# Patient Record
Sex: Male | Born: 1995 | Hispanic: Yes | Marital: Married | Smoking: Never smoker
Health system: Southern US, Community
[De-identification: ages and names within clinical notes are randomized; demographics above are authoritative.]

## PROBLEM LIST (undated history)

## (undated) DIAGNOSIS — I10 Essential (primary) hypertension: Secondary | ICD-10-CM

## (undated) DIAGNOSIS — E785 Hyperlipidemia, unspecified: Secondary | ICD-10-CM

## (undated) HISTORY — DX: Essential (primary) hypertension: I10

## (undated) HISTORY — DX: Hyperlipidemia, unspecified: E78.5

---

## 2021-10-17 ENCOUNTER — Ambulatory Visit (INDEPENDENT_AMBULATORY_CARE_PROVIDER_SITE_OTHER): Payer: 59 | Admitting: Family Medicine

## 2021-10-17 ENCOUNTER — Encounter (HOSPITAL_BASED_OUTPATIENT_CLINIC_OR_DEPARTMENT_OTHER): Payer: Self-pay | Admitting: Family Medicine

## 2021-10-17 ENCOUNTER — Other Ambulatory Visit: Payer: Self-pay

## 2021-10-17 DIAGNOSIS — D1801 Hemangioma of skin and subcutaneous tissue: Secondary | ICD-10-CM | POA: Insufficient documentation

## 2021-10-17 DIAGNOSIS — R1013 Epigastric pain: Secondary | ICD-10-CM | POA: Diagnosis not present

## 2021-10-17 DIAGNOSIS — E669 Obesity, unspecified: Secondary | ICD-10-CM | POA: Diagnosis not present

## 2021-10-17 DIAGNOSIS — E785 Hyperlipidemia, unspecified: Secondary | ICD-10-CM | POA: Diagnosis not present

## 2021-10-17 DIAGNOSIS — I10 Essential (primary) hypertension: Secondary | ICD-10-CM | POA: Diagnosis not present

## 2021-10-17 NOTE — Assessment & Plan Note (Signed)
CheckCan continue with atorvastatin at this time Will labs to assess current status

## 2021-10-17 NOTE — Progress Notes (Signed)
New Patient Office Visit  Subjective:  Patient ID: Jim Kim, male    DOB: [birth year]  Age: 26 y.o. MRN: [id number]  CC:  Chief Complaint  Patient presents with   Establish Care    Prior PCP in [place]   Hypertension    Patient has a hx of HTN and Hyperlipemia. He is currently on losartan and atorvastatin for management. He does monitor his BP at home   Abdominal Pain    Patient states he has been having epigastric pain since December. He noticed the pain after he contracted covid.   Rash    Patient states that in September he noticed raised red bumps on his arms. They have not gone away and he is wanting to have them assessed.     HPI Jim Kim is a 26 year old male presenting to establish in clinic.  He has current concerns as outlined above.  Past medical history is significant for hypertension, hyperlipidemia.  Hypertension: Reports being started on losartan without 4 to 5 years ago.  Has been taking daily, denies any issues with this medication.  Denies any chest pain, headaches, lightheadedness or dizziness.  Does check blood pressure at home, average reading over the past couple weeks has been systolic 497-026 and diastolic 37-85.  Does report family history of hypertension.  Hyperlipidemia: Currently taking atorvastatin, started about 2 years ago.  Denies any issues with myalgias.  Last labs was about a year ago.  Rash: Reports noticing small red dots on his skin over recent months.  Denies any associated itching, pain, burning, drainage.  Abdominal pain: Reports having upper abdominal pain recently, did start OTC PPI which he reports has provided significant relief of symptoms.  Denies any associated nausea, vomiting, diarrhea or constipation.  Patient is originally from [place], is moving here from [place].  He has been living here for about 6 months, moved due to work.  Works as a Advertising account executive.  Outside of work, he enjoys playing soccer and  running.  Past Medical History:  Diagnosis Date   Hyperlipidemia    Hypertension    History reviewed. No pertinent surgical history.  Family History  Problem Relation Age of Onset   Diabetes Mother    Hypertension Father    Diabetes Maternal Grandmother    Kidney disease Maternal Grandfather    Diabetes Paternal Grandmother     Social History   Socioeconomic History   Marital status: Married    Spouse name: Not on file   Number of children: Not on file   Years of education: Not on file   Highest education level: Not on file  Occupational History   Not on file  Tobacco Use   Smoking status: Never   Smokeless tobacco: Never  Vaping Use   Vaping Use: Never used  Substance and Sexual Activity   Alcohol use: Yes    Comment: rarely - 1-2 every few months   Drug use: Never   Sexual activity: Yes  Other Topics Concern   Not on file  Social History Narrative   Not on file   Social Determinants of Health   Financial Resource Strain: Not on file  Food Insecurity: Not on file  Transportation Needs: Not on file  Physical Activity: Not on file  Stress: Not on file  Social Connections: Not on file  Intimate Partner Violence: Not on file    Objective:   Today's Vitals: BP (!) 140/100    Pulse 70    Ht 5\' 11"  (  1.803 m)    Wt 235 lb (106.6 kg)    SpO2 99%    BMI 32.78 kg/m   Physical Exam  26 year old male in no acute distress Cardiovascular exam with regular rate and rhythm, no murmur appreciated Lungs clear to auscultation bilaterally Skin lesions that patient is indicating appear to be small hemangiomas with no significant abnormalities surrounding them, no drainage, most areas are extremely small on exam  Assessment & Plan:   Problem List Items Addressed This Visit       Cardiovascular and Mediastinum   Primary hypertension    Blood pressure borderline in office today, reports adequate control on home readings Will continue with losartan at  present Recommend intermittent monitoring at home, DASH diet, regular aerobic exercise      Relevant Medications   losartan (COZAAR) 50 MG tablet   atorvastatin (LIPITOR) 20 MG tablet   Other Relevant Orders   CBC with Differential/Platelet   Comprehensive metabolic panel   TSH Rfx on Abnormal to Free T4   Cherry angioma    Skin lesions appear most consistent with cherry angiomas given appearance Discussed benign nature of these lesions as well as the likelihood of having more lesions develop in the future      Relevant Medications   losartan (COZAAR) 50 MG tablet   atorvastatin (LIPITOR) 20 MG tablet     Other   Hyperlipidemia    CheckCan continue with atorvastatin at this time Will labs to assess current status      Relevant Medications   losartan (COZAAR) 50 MG tablet   atorvastatin (LIPITOR) 20 MG tablet   Other Relevant Orders   Lipid panel   Obesity (BMI 30.0-34.9)    Is active with soccer and running, however has had decreased activity following coronavirus infection a little over 1 month ago Recommend gradual return to aerobic exercise, did discuss gradual progression of activities given that he may have had some deconditioning with his time away from exercising We will check labs today      Relevant Orders   Comprehensive metabolic panel   Hemoglobin A1c   Epigastric pain    Possibly related to reflux Did start OTC PPI and has noticed improvement in symptoms Will continue monitor moving forward, consider further evaluation pending progress       Outpatient Encounter Medications as of 10/17/2021  Medication Sig   atorvastatin (LIPITOR) 20 MG tablet Take 20 mg by mouth daily.   losartan (COZAAR) 50 MG tablet Take 50 mg by mouth daily.   No facility-administered encounter medications on file as of 10/17/2021.   Spent 50 minutes on this patient encounter, including preparation, chart review, face-to-face counseling with patient and coordination of care, and  documentation of encounter  Follow-up: Return in about 2 months (around 12/15/2021).   Harout Scheurich J De [place], MD

## 2021-10-17 NOTE — Patient Instructions (Addendum)
Medication Instructions:  Your physician recommends that you continue on your current medications as directed. Please refer to the Current Medication list given to you today. --If you need a refill on any your medications before your next appointment, please call your pharmacy first. If no refills are authorized on file call the office.-- Follow-Up: Your next appointment:   Your physician recommends that you schedule a follow-up appointment in: 2 MONTHS with Dr. de [place]  You will receive a text message or e-mail with a link to a survey about your care and experience with [place] today! We would greatly appreciate your feedback!   Thanks for letting us be apart of your health journey!!  Primary Care and Sports Medicine   Dr. Arlina Robes [place]   We encourage you to activate your patient portal called "MyChart".  Sign up information is provided on this After Visit Summary.  MyChart is used to connect with patients for Virtual Visits (Telemedicine).  Patients are able to view lab/test results, encounter notes, upcoming appointments, etc.  Non-urgent messages can be sent to your provider as well. To learn more about what you can do with MyChart, please visit --  [email or web address].         DASH Eating Plan DASH stands for Dietary Approaches to Stop Hypertension. The DASH eating plan is a healthy eating plan that has been shown to: Reduce high blood pressure (hypertension). Reduce your risk for type 2 diabetes, heart disease, and stroke. Help with weight loss. What are tips for following this plan? Reading food labels Check food labels for the amount of salt (sodium) per serving. Choose foods with less than 5 percent of the Daily Value of sodium. Generally, foods with less than 300 milligrams (mg) of sodium per serving fit into this eating plan. To find whole grains, look for the word "whole" as the first word in the ingredient list. Shopping Buy products labeled as "low-sodium" or "no salt  added." Buy fresh foods. Avoid canned foods and pre-made or frozen meals. Cooking Avoid adding salt when cooking. Use salt-free seasonings or herbs instead of table salt or sea salt. Check with your health care provider or pharmacist before using salt substitutes. Do not fry foods. Cook foods using healthy methods such as baking, boiling, grilling, roasting, and broiling instead. Cook with heart-healthy oils, such as olive, canola, avocado, soybean, or sunflower oil. Meal planning  Eat a balanced diet that includes: 4 or more servings of fruits and 4 or more servings of vegetables each day. Try to fill one-half of your plate with fruits and vegetables. 6-8 servings of whole grains each day. Less than 6 oz (170 g) of lean meat, poultry, or fish each day. A 3-oz (85-g) serving of meat is about the same size as a deck of cards. One egg equals 1 oz (28 g). 2-3 servings of low-fat dairy each day. One serving is 1 cup (237 mL). 1 serving of nuts, seeds, or beans 5 times each week. 2-3 servings of heart-healthy fats. Healthy fats called omega-3 fatty acids are found in foods such as walnuts, flaxseeds, fortified milks, and eggs. These fats are also found in cold-water fish, such as sardines, salmon, and mackerel. Limit how much you eat of: Canned or prepackaged foods. Food that is high in trans fat, such as some fried foods. Food that is high in saturated fat, such as fatty meat. Desserts and other sweets, sugary drinks, and other foods with added sugar. Full-fat dairy products. Do not salt foods  before eating. Do not eat more than 4 egg yolks a week. Try to eat at least 2 vegetarian meals a week. Eat more home-cooked food and less restaurant, buffet, and fast food. Lifestyle When eating at a restaurant, ask that your food be prepared with less salt or no salt, if possible. If you drink alcohol: Limit how much you use to: 0-1 drink a day for women who are not pregnant. 0-2 drinks a day for  men. Be aware of how much alcohol is in your drink. In the [place] one drink equals one 12 oz bottle of beer (355 mL), one 5 oz glass of wine (148 mL), or one 1 oz glass of hard liquor (44 mL). General information Avoid eating more than 2,300 mg of salt a day. If you have hypertension, you may need to reduce your sodium intake to 1,500 mg a day. Work with your health care provider to maintain a healthy body weight or to lose weight. Ask what an ideal weight is for you. Get at least 30 minutes of exercise that causes your heart to beat faster (aerobic exercise) most days of the week. Activities may include walking, swimming, or biking. Work with your health care provider or dietitian to adjust your eating plan to your individual calorie needs. What foods should I eat? Fruits All fresh, dried, or frozen fruit. Canned fruit in natural juice (without added sugar). Vegetables Fresh or frozen vegetables (raw, steamed, roasted, or grilled). Low-sodium or reduced-sodium tomato and vegetable juice. Low-sodium or reduced-sodium tomato sauce and tomato paste. Low-sodium or reduced-sodium canned vegetables. Grains Whole-grain or whole-wheat bread. Whole-grain or whole-wheat pasta. Brown rice. [place]. Bulgur. Whole-grain and low-sodium cereals. Pita bread. Low-fat, low-sodium crackers. Whole-wheat flour tortillas. Meats and other proteins Skinless chicken or [place]. Ground chicken or [place]. Pork with fat trimmed off. Fish and seafood. Egg whites. Dried beans, peas, or lentils. Unsalted nuts, nut butters, and seeds. Unsalted canned beans. Lean cuts of beef with fat trimmed off. Low-sodium, lean precooked or cured meat, such as sausages or meat loaves. Dairy Low-fat (1%) or fat-free (skim) milk. Reduced-fat, low-fat, or fat-free cheeses. Nonfat, low-sodium ricotta or cottage cheese. Low-fat or nonfat yogurt. Low-fat, low-sodium cheese. Fats and oils Soft margarine without trans fats. Vegetable oil.  Reduced-fat, low-fat, or light mayonnaise and salad dressings (reduced-sodium). Canola, safflower, olive, avocado, soybean, and sunflower oils. Avocado. Seasonings and condiments Herbs. Spices. Seasoning mixes without salt. Other foods Unsalted popcorn and pretzels. Fat-free sweets. The items listed above may not be a complete list of foods and beverages you can eat. Contact a dietitian for more information. What foods should I avoid? Fruits Canned fruit in a light or heavy syrup. Fried fruit. Fruit in cream or butter sauce. Vegetables Creamed or fried vegetables. Vegetables in a cheese sauce. Regular canned vegetables (not low-sodium or reduced-sodium). Regular canned tomato sauce and paste (not low-sodium or reduced-sodium). Regular tomato and vegetable juice (not low-sodium or reduced-sodium). Angie Fava. Olives. Grains Baked goods made with fat, such as croissants, muffins, or some breads. Dry pasta or rice meal packs. Meats and other proteins Fatty cuts of meat. Ribs. Fried meat. Berniece Salines. Bologna, salami, and other precooked or cured meats, such as sausages or meat loaves. Fat from the back of a pig (fatback). Bratwurst. Salted nuts and seeds. Canned beans with added salt. Canned or smoked fish. Whole eggs or egg yolks. Chicken or [place] with skin. Dairy Whole or 2% milk, cream, and half-and-half. Whole or full-fat cream cheese. Whole-fat or sweetened  yogurt. Full-fat cheese. Nondairy creamers. Whipped toppings. Processed cheese and cheese spreads. Fats and oils Butter. Stick margarine. Lard. Shortening. Ghee. Bacon fat. Tropical oils, such as coconut, palm kernel, or palm oil. Seasonings and condiments Onion salt, garlic salt, seasoned salt, table salt, and sea salt. Worcestershire sauce. Tartar sauce. Barbecue sauce. Teriyaki sauce. Soy sauce, including reduced-sodium. Steak sauce. Canned and packaged gravies. Fish sauce. Oyster sauce. Cocktail sauce. Store-bought horseradish. Ketchup. Mustard.  Meat flavorings and tenderizers. Bouillon cubes. Hot sauces. Pre-made or packaged marinades. Pre-made or packaged taco seasonings. Relishes. Regular salad dressings. Other foods Salted popcorn and pretzels. The items listed above may not be a complete list of foods and beverages you should avoid. Contact a dietitian for more information. Where to find more information National Heart, Lung, and Blood Institute: https://wilson-eaton.com/ American Heart Association: www.heart.org Academy of Nutrition and Dietetics: www.eatright.[place]: www.kidney.org Summary The DASH eating plan is a healthy eating plan that has been shown to reduce high blood pressure (hypertension). It may also reduce your risk for type 2 diabetes, heart disease, and stroke. When on the DASH eating plan, aim to eat more fresh fruits and vegetables, whole grains, lean proteins, low-fat dairy, and heart-healthy fats. With the DASH eating plan, you should limit salt (sodium) intake to 2,300 mg a day. If you have hypertension, you may need to reduce your sodium intake to 1,500 mg a day. Work with your health care provider or dietitian to adjust your eating plan to your individual calorie needs. This information is not intended to replace advice given to you by your health care provider. Make sure you discuss any questions you have with your health care provider. Document Revised: 08/06/2019 Document Reviewed: 08/06/2019 Elsevier Patient Education  2022 [employer].

## 2021-10-17 NOTE — Assessment & Plan Note (Signed)
Blood pressure borderline in office today, reports adequate control on home readings Will continue with losartan at present Recommend intermittent monitoring at home, DASH diet, regular aerobic exercise

## 2021-10-17 NOTE — Assessment & Plan Note (Signed)
Is active with soccer and running, however has had decreased activity following coronavirus infection a little over 1 month ago Recommend gradual return to aerobic exercise, did discuss gradual progression of activities given that he may have had some deconditioning with his time away from exercising We will check labs today

## 2021-10-17 NOTE — Assessment & Plan Note (Signed)
Skin lesions appear most consistent with cherry angiomas given appearance Discussed benign nature of these lesions as well as the likelihood of having more lesions develop in the future

## 2021-10-17 NOTE — Assessment & Plan Note (Signed)
Possibly related to reflux Did start OTC PPI and has noticed improvement in symptoms Will continue monitor moving forward, consider further evaluation pending progress

## 2021-10-31 ENCOUNTER — Encounter (HOSPITAL_BASED_OUTPATIENT_CLINIC_OR_DEPARTMENT_OTHER): Payer: Self-pay

## 2021-10-31 ENCOUNTER — Other Ambulatory Visit (HOSPITAL_BASED_OUTPATIENT_CLINIC_OR_DEPARTMENT_OTHER): Payer: Self-pay | Admitting: Family Medicine

## 2021-10-31 ENCOUNTER — Ambulatory Visit (HOSPITAL_BASED_OUTPATIENT_CLINIC_OR_DEPARTMENT_OTHER): Payer: 59

## 2021-11-01 LAB — CBC WITH DIFFERENTIAL/PLATELET
Basophils Absolute: 0.1 10*3/uL (ref 0.0–0.2)
Basos: 1 %
EOS (ABSOLUTE): 0.1 10*3/uL (ref 0.0–0.4)
Eos: 2 %
Hematocrit: 46.9 % (ref 37.5–51.0)
Hemoglobin: 15.5 g/dL (ref 13.0–17.7)
Immature Grans (Abs): 0 10*3/uL (ref 0.0–0.1)
Immature Granulocytes: 0 %
Lymphocytes Absolute: 1.8 10*3/uL (ref 0.7–3.1)
Lymphs: 30 %
MCH: 27.1 pg (ref 26.6–33.0)
MCHC: 33 g/dL (ref 31.5–35.7)
MCV: 82 fL (ref 79–97)
Monocytes Absolute: 0.5 10*3/uL (ref 0.1–0.9)
Monocytes: 8 %
Neutrophils Absolute: 3.5 10*3/uL (ref 1.4–7.0)
Neutrophils: 59 %
Platelets: 203 10*3/uL (ref 150–450)
RBC: 5.71 x10E6/uL (ref 4.14–5.80)
RDW: 13.5 % (ref 11.6–15.4)
WBC: 5.9 10*3/uL (ref 3.4–10.8)

## 2021-11-01 LAB — LIPID PANEL
Chol/HDL Ratio: 5.1 ratio — ABNORMAL HIGH (ref 0.0–5.0)
Cholesterol, Total: 158 mg/dL (ref 100–199)
HDL: 31 mg/dL — ABNORMAL LOW (ref >39)
LDL Chol Calc (NIH): 85 mg/dL (ref 0–99)
Triglycerides: 254 mg/dL — ABNORMAL HIGH (ref 0–149)
VLDL Cholesterol Cal: 42 mg/dL — ABNORMAL HIGH (ref 5–40)

## 2021-11-01 LAB — COMPREHENSIVE METABOLIC PANEL
ALT: 48 IU/L — ABNORMAL HIGH (ref 0–44)
AST: 34 IU/L (ref 0–40)
Albumin/Globulin Ratio: 1.8 (ref 1.2–2.2)
Albumin: 4.8 g/dL (ref 4.1–5.2)
Alkaline Phosphatase: 135 IU/L — ABNORMAL HIGH (ref 44–121)
BUN/Creatinine Ratio: 11 (ref 9–20)
BUN: 10 mg/dL (ref 6–20)
Bilirubin Total: 0.5 mg/dL (ref 0.0–1.2)
CO2: 25 mmol/L (ref 20–29)
Calcium: 9.6 mg/dL (ref 8.7–10.2)
Chloride: 101 mmol/L (ref 96–106)
Creatinine, Ser: 0.92 mg/dL (ref 0.76–1.27)
Globulin, Total: 2.7 g/dL (ref 1.5–4.5)
Glucose: 90 mg/dL (ref 70–99)
Potassium: 4.4 mmol/L (ref 3.5–5.2)
Sodium: 141 mmol/L (ref 134–144)
Total Protein: 7.5 g/dL (ref 6.0–8.5)
eGFR: 118 mL/min/{1.73_m2} (ref >59)

## 2021-11-01 LAB — TSH RFX ON ABNORMAL TO FREE T4: TSH: 1.49 u[IU]/mL (ref 0.450–4.500)

## 2021-11-01 LAB — HEMOGLOBIN A1C
Est. average glucose Bld gHb Est-mCnc: 111 mg/dL
Hgb A1c MFr Bld: 5.5 % (ref 4.8–5.6)

## 2021-11-05 ENCOUNTER — Telehealth (HOSPITAL_BASED_OUTPATIENT_CLINIC_OR_DEPARTMENT_OTHER): Payer: Self-pay

## 2021-11-05 DIAGNOSIS — R7401 Elevation of levels of liver transaminase levels: Secondary | ICD-10-CM

## 2021-11-05 DIAGNOSIS — R748 Abnormal levels of other serum enzymes: Secondary | ICD-10-CM

## 2021-11-05 NOTE — Telephone Encounter (Signed)
-----   Message from Raymond J de [place], MD sent at 11/02/2021  1:59 PM EST ----- White blood cell and red blood cell counts are normal with normal hemoglobin.  Electrolytes and kidney function are normal.  ALT which is a liver enzyme is slightly elevated.  Hemoglobin A1c is within normal range.  Thyroid function is normal.  Lipid panel with elevated triglycerides and elevated "bad" cholesterol.  Would initially focus on lifestyle modifications to address cholesterol issues - including dietary changes such as incorporating fresh fruits and vegetables, lean protein in the diet and reducing consumption of red meats, saturated fats, processed foods.  Recommend gradually increasing level of activity with eventual goal of about 150 minutes/week of moderate intensity aerobic exercise. Due to slightly elevated ALT and alkaline phosphatase, would proceed with additional laboratory evaluation for viral causes of elevated ALT or iron storage issue, would also check GGT in regards to the elevated alkaline phosphatase.  Additionally will check right upper quadrant ultrasound.  Likely, the lab test can be added onto prior blood drawn.

## 2021-11-05 NOTE — Telephone Encounter (Signed)
Results released by Dr. de [place] and reviewed by patient via [place] patient to contact the office with any questions or concerns. Orders placed Add on labs faxed to [facility]

## 2021-11-07 LAB — IRON AND TIBC
Iron Saturation: 31 % (ref 15–55)
Iron: 91 ug/dL (ref 38–169)
Total Iron Binding Capacity: 290 ug/dL (ref 250–450)
UIBC: 199 ug/dL (ref 111–343)

## 2021-11-07 LAB — HEPATITIS B CORE ANTIBODY, IGM: Hep B C IgM: NEGATIVE

## 2021-11-07 LAB — HEPATITIS C ANTIBODY: Hep C Virus Ab: NONREACTIVE

## 2021-11-07 LAB — SPECIMEN STATUS REPORT

## 2021-11-07 LAB — GAMMA GT: GGT: 17 IU/L (ref 0–65)

## 2021-11-07 LAB — HEPATITIS B SURFACE ANTIGEN: Hepatitis B Surface Ag: NEGATIVE

## 2021-11-07 LAB — FERRITIN: Ferritin: 134 ng/mL (ref 30–400)

## 2021-11-07 LAB — HEPATITIS B SURFACE ANTIBODY,QUALITATIVE: Hep B Surface Ab, Qual: REACTIVE

## 2021-11-08 ENCOUNTER — Telehealth (HOSPITAL_BASED_OUTPATIENT_CLINIC_OR_DEPARTMENT_OTHER): Payer: Self-pay

## 2021-11-08 NOTE — Telephone Encounter (Signed)
-----   Message from Raymond J de [place], MD sent at 11/08/2021  9:00 AM EST ----- Testing for hepatitis B and C is negative.  Iron studies are normal.  GGT is normal.  Awaiting results of right upper quadrant ultrasound which appears to be scheduled in early March.

## 2021-11-08 NOTE — Telephone Encounter (Signed)
Per DPR left detailed message on voicemail Instructed patient to contact the office with any questions Informed patient that results and recommendation are also available via mychart

## 2021-11-16 ENCOUNTER — Ambulatory Visit
Admission: RE | Admit: 2021-11-16 | Discharge: 2021-11-16 | Disposition: A | Discharge status: Home / Self Care | Payer: 59 | Source: Ambulatory Visit | Attending: Family Medicine | Admitting: Family Medicine

## 2021-11-16 DIAGNOSIS — R7401 Elevation of levels of liver transaminase levels: Secondary | ICD-10-CM

## 2021-11-16 DIAGNOSIS — R748 Abnormal levels of other serum enzymes: Secondary | ICD-10-CM

## 2021-12-05 ENCOUNTER — Other Ambulatory Visit (HOSPITAL_BASED_OUTPATIENT_CLINIC_OR_DEPARTMENT_OTHER): Payer: Self-pay | Admitting: Family Medicine

## 2021-12-05 DIAGNOSIS — R160 Hepatomegaly, not elsewhere classified: Secondary | ICD-10-CM

## 2021-12-05 DIAGNOSIS — K769 Liver disease, unspecified: Secondary | ICD-10-CM

## 2021-12-17 ENCOUNTER — Ambulatory Visit (INDEPENDENT_AMBULATORY_CARE_PROVIDER_SITE_OTHER): Payer: 59 | Admitting: Family Medicine

## 2021-12-17 ENCOUNTER — Encounter (HOSPITAL_BASED_OUTPATIENT_CLINIC_OR_DEPARTMENT_OTHER): Payer: Self-pay | Admitting: Family Medicine

## 2021-12-17 VITALS — BP 128/88 | HR 86 | Temp 98.7°F | Ht 71.0 in | Wt 239.1 lb

## 2021-12-17 DIAGNOSIS — R1013 Epigastric pain: Secondary | ICD-10-CM | POA: Diagnosis not present

## 2021-12-17 DIAGNOSIS — K769 Liver disease, unspecified: Secondary | ICD-10-CM

## 2021-12-17 DIAGNOSIS — I10 Essential (primary) hypertension: Secondary | ICD-10-CM | POA: Diagnosis not present

## 2021-12-17 DIAGNOSIS — K760 Fatty (change of) liver, not elsewhere classified: Secondary | ICD-10-CM | POA: Diagnosis not present

## 2021-12-17 MED ORDER — LOSARTAN POTASSIUM 50 MG PO TABS: 50.0000 mg | ORAL_TABLET | Freq: Every day | ORAL | 1 refills | Status: AC

## 2021-12-17 NOTE — Assessment & Plan Note (Signed)
Reports that he has been doing well.  Has not been checking blood pressure at home.  Denies any chest pain or headaches.  Has been making lifestyle modifications including reducing salt in diet as well as increasing exercise.  Primarily he has been playing soccer to 3 times a week with coworkers and going for a walk on the weekend. ?Requesting refill of losartan today ?Blood pressure adequate in office today, will refill losartan ?Recommend intermittent monitoring of blood pressure at home, DASH diet, regular exercise ?

## 2021-12-17 NOTE — Patient Instructions (Signed)
?  Medication Instructions:  ?Your physician recommends that you continue on your current medications as directed. Please refer to the Current Medication list given to you today. ?--If you need a refill on any your medications before your next appointment, please call your pharmacy first. If no refills are authorized on file call the office.-- ? ? ? ?Follow-Up: ?Your next appointment:   ?Your physician recommends that you schedule a follow-up appointment in: 3 month HTN with Dr. de [place] ? ?You will receive a text message or e-mail with a link to a survey about your care and experience with [place] today! We would greatly appreciate your feedback!  ? ?Thanks for letting us be apart of your health journey!!  ?Primary Care and Sports Medicine  ? ?Dr. Kyung Rudd de [place]  ? ?We encourage you to activate your patient portal called "MyChart".  Sign up information is provided on this After Visit Summary.  MyChart is used to connect with patients for Virtual Visits (Telemedicine).  Patients are able to view lab/test results, encounter notes, upcoming appointments, etc.  Non-urgent messages can be sent to your provider as well. To learn more about what you can do with MyChart, please visit --  [email or web address].   ? ?

## 2021-12-17 NOTE — Assessment & Plan Note (Addendum)
Hypoechoic area observed on ultrasound of liver.  Patient has follow-up MRI scheduled for next week for further evaluation ?

## 2021-12-17 NOTE — Progress Notes (Signed)
? ? ?  Procedures performed today:   ? ?None. ? ?Independent interpretation of notes and tests performed by another provider:  ? ?None. ? ?Brief History, Exam, Impression, and Recommendations:   ? ?BP 128/88   Pulse 86   Temp 98.7 ?F (37.1 ?C)   Ht '5\' 11"'$  (1.803 m)   Wt 239 lb 1.6 oz (108.5 kg)   SpO2 99%   BMI 33.35 kg/m?  ? ?Lesion of liver greater than 1 cm in diameter ?Hypoechoic area observed on ultrasound of liver.  Patient has follow-up MRI scheduled for next week for further evaluation ? ?Primary hypertension ?Reports that he has been doing well.  Has not been checking blood pressure at home.  Denies any chest pain or headaches.  Has been making lifestyle modifications including reducing salt in diet as well as increasing exercise.  Primarily he has been playing soccer to 3 times a week with coworkers and going for a walk on the weekend. ?Requesting refill of losartan today ?Blood pressure adequate in office today, will refill losartan ?Recommend intermittent monitoring of blood pressure at home, DASH diet, regular exercise ? ?Hepatic steatosis ?Discussed findings from recent right upper quadrant ultrasound which showed hepatic steatosis as well as hypoechoic area within the liver.  Discussed general recommendations including lifestyle modifications to work towards healthy, gradual weight loss with goal of at least 10% loss of body weight ?Patient has been working on lifestyle modifications, encouraged to continue with this ? ?Epigastric pain ?Symptoms have been well controlled with use of PPI, can continue with this at this time ?Plan to discuss gradual weaning of PPI in the future as long as patient remains asymptomatic ? ?Plan for follow-up in about 3 months or sooner as needed ? ? ?___________________________________________ ?Deloyd Handy de [place], MD, ABFM, CAQSM ?Primary Care and Sports Medicine ?[facility] ?? ?

## 2021-12-17 NOTE — Assessment & Plan Note (Signed)
Discussed findings from recent right upper quadrant ultrasound which showed hepatic steatosis as well as hypoechoic area within the liver.  Discussed general recommendations including lifestyle modifications to work towards healthy, gradual weight loss with goal of at least 10% loss of body weight ?Patient has been working on lifestyle modifications, encouraged to continue with this ?

## 2021-12-17 NOTE — Assessment & Plan Note (Signed)
Symptoms have been well controlled with use of PPI, can continue with this at this time ?Plan to discuss gradual weaning of PPI in the future as long as patient remains asymptomatic ?

## 2021-12-24 ENCOUNTER — Telehealth (HOSPITAL_BASED_OUTPATIENT_CLINIC_OR_DEPARTMENT_OTHER): Payer: Self-pay | Admitting: Family Medicine

## 2021-12-24 NOTE — Telephone Encounter (Signed)
Received vm on Friday that [facility] needed an authorization for Pt's MRI. Called back and spoke to a representative, [place]. States that pt has exhausted all of his benefits & ins company will not approve MRI at this time. Representative states that they will call Mr. Pepperman and possibly cancel appt. Advised CMA as well as to the status of this matter. ?

## 2021-12-25 ENCOUNTER — Other Ambulatory Visit (HOSPITAL_BASED_OUTPATIENT_CLINIC_OR_DEPARTMENT_OTHER): Payer: Self-pay | Admitting: Family Medicine

## 2021-12-25 ENCOUNTER — Inpatient Hospital Stay: Admission: RE | Admit: 2021-12-25 | Payer: 59 | Source: Ambulatory Visit

## 2021-12-25 DIAGNOSIS — R160 Hepatomegaly, not elsewhere classified: Secondary | ICD-10-CM

## 2021-12-25 DIAGNOSIS — K769 Liver disease, unspecified: Secondary | ICD-10-CM

## 2022-01-18 ENCOUNTER — Ambulatory Visit
Admission: RE | Admit: 2022-01-18 | Discharge: 2022-01-18 | Disposition: A | Discharge status: Home / Self Care | Payer: No Typology Code available for payment source | Source: Ambulatory Visit | Attending: Family Medicine | Admitting: Family Medicine

## 2022-01-18 DIAGNOSIS — K769 Liver disease, unspecified: Secondary | ICD-10-CM

## 2022-01-18 DIAGNOSIS — R160 Hepatomegaly, not elsewhere classified: Secondary | ICD-10-CM

## 2022-01-18 MED ORDER — GADOBENATE DIMEGLUMINE 529 MG/ML IV SOLN
20.0000 mL | Freq: Once | INTRAVENOUS | Status: AC | PRN
  Administered 2022-01-18: 20 mL via INTRAVENOUS

## 2022-03-18 ENCOUNTER — Ambulatory Visit (HOSPITAL_BASED_OUTPATIENT_CLINIC_OR_DEPARTMENT_OTHER): Payer: 59 | Admitting: Family Medicine

## 2022-04-05 ENCOUNTER — Encounter (HOSPITAL_BASED_OUTPATIENT_CLINIC_OR_DEPARTMENT_OTHER): Payer: Self-pay | Admitting: Family Medicine

## 2022-04-10 ENCOUNTER — Ambulatory Visit (INDEPENDENT_AMBULATORY_CARE_PROVIDER_SITE_OTHER): Payer: Self-pay | Admitting: Family Medicine

## 2022-04-10 ENCOUNTER — Encounter (HOSPITAL_BASED_OUTPATIENT_CLINIC_OR_DEPARTMENT_OTHER): Payer: Self-pay | Admitting: Family Medicine

## 2022-04-10 VITALS — BP 150/83 | HR 80 | Ht 71.0 in | Wt 242.6 lb

## 2022-04-10 DIAGNOSIS — I10 Essential (primary) hypertension: Secondary | ICD-10-CM

## 2022-04-10 NOTE — Assessment & Plan Note (Signed)
Blood pressure is slightly elevated and office today, at last appointment it was better controlled.  He continues with losartan, denies any issues with this, not needing refill today Has not been checking blood pressure at home regularly.  Denies any issues with chest pain or headaches Given that blood pressure has previously been better controlled on current regimen, will allow for continuation without any changes today.  Recommend intermittent monitoring of blood pressure at home, DASH diet

## 2022-04-10 NOTE — Patient Instructions (Signed)
  Medication Instructions:  Your physician recommends that you continue on your current medications as directed. Please refer to the Current Medication list given to you today. --If you need a refill on any your medications before your next appointment, please call your pharmacy first. If no refills are authorized on file call the office.-- Lab Work: Your physician has recommended that you have lab work today: No If you have labs (blood work) drawn today and your tests are completely normal, you will receive your results via MyChart message OR a phone call from our staff.  Please ensure you check your voicemail in the event that you authorized detailed messages to be left on a delegated number. If you have any lab test that is abnormal or we need to change your treatment, we will call you to review the results.  Referrals/Procedures/Imaging: No  Follow-Up: Your next appointment:   Your physician recommends that you schedule a follow-up appointment in: 4 months with Dr. de [place].  You will receive a text message or e-mail with a link to a survey about your care and experience with us today! We would greatly appreciate your feedback!   Thanks for letting us be apart of your health journey!!  [facility]   Dr. Raymond de [place]   We encourage you to activate your patient portal called "MyChart".  Sign up information is provided on this After Visit Summary.  MyChart is used to connect with patients for Virtual Visits (Telemedicine).  Patients are able to view lab/test results, encounter notes, upcoming appointments, etc.  Non-urgent messages can be sent to your provider as well. To learn more about what you can do with MyChart, please visit --  [email or web address].    

## 2022-04-10 NOTE — Progress Notes (Signed)
    Procedures performed today:    None.  Independent interpretation of notes and tests performed by another provider:   None.  Brief History, Exam, Impression, and Recommendations:    BP (!) 150/83   Pulse 80   Ht '5\' 11"'$  (1.803 m)   Wt 242 lb 9.6 oz (110 kg)   SpO2 100%   BMI 33.84 kg/m   Primary hypertension Blood pressure is slightly elevated and office today, at last appointment it was better controlled.  He continues with losartan, denies any issues with this, not needing refill today Has not been checking blood pressure at home regularly.  Denies any issues with chest pain or headaches Given that blood pressure has previously been better controlled on current regimen, will allow for continuation without any changes today.  Recommend intermittent monitoring of blood pressure at home, DASH diet  Return in about 4 months (around 08/11/2022) for HTN.   ___________________________________________ Caleesi Kohl de [place], MD, ABFM, CA[facility] [place]  Outpatient Encounter Medications as of 04/10/2022  Medication Sig   atorvastatin (LIPITOR) 20 MG tablet Take 20 mg by mouth daily.   losartan (COZAAR) 50 MG tablet Take 1 tablet (50 mg total) by mouth daily.   No facility-administered encounter medications on file as of 04/10/2022.

## 2022-08-12 ENCOUNTER — Ambulatory Visit (HOSPITAL_BASED_OUTPATIENT_CLINIC_OR_DEPARTMENT_OTHER): Payer: No Typology Code available for payment source | Admitting: Family Medicine

## 2022-08-13 ENCOUNTER — Ambulatory Visit (HOSPITAL_BASED_OUTPATIENT_CLINIC_OR_DEPARTMENT_OTHER): Payer: No Typology Code available for payment source | Admitting: Family Medicine

## 2023-01-05 IMAGING — MR MR ABDOMEN WO/W CM
25 of 26 series · 47 of 48 positions shown · IV contrast (multihance)
Comparison: Abdominal ultrasound 11/16/2021

CLINICAL DATA: Liver lesion

EXAM:
MRI ABDOMEN WITHOUT AND WITH CONTRAST
TECHNIQUE: Multiplanar multisequence MR imaging of the abdomen was performed
both before and after the administration of intravenous contrast.
CONTRAST:  20mL MULTIHANCE GADOBENATE DIMEGLUMINE 529 MG/ML IV SOLN

[Series 3: T2 · coronal · 5.0mm · 1.56mm/px · 1 of 37 slices shown (1 of 4)]
[im 1/37]
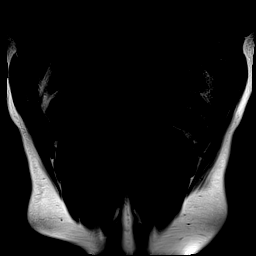

[Series 4: T1 · axial · 3.0mm · 1.19mm/px · z∈[-89,+124]mm · 4 of 144 slices shown]
[im 1/144]
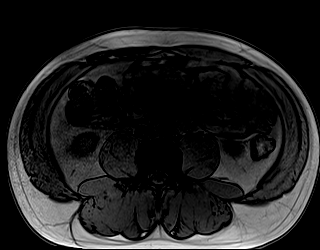
[im 48/144]
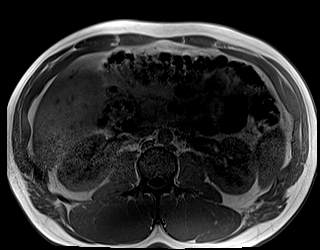
[im 96/144]
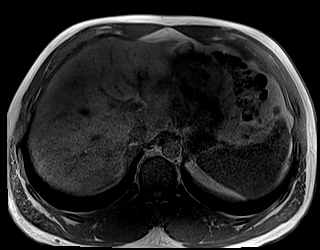
[im 144/144]
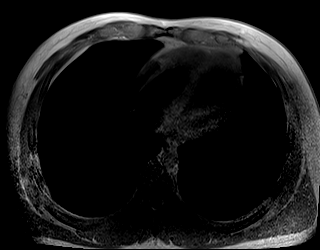

[Series 5: bSSFP · axial · 5.0mm · 1.25mm/px · 1 of 38 slices shown]
[im 1/38]
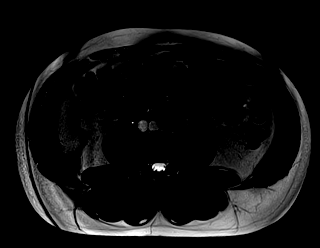

[Series 6: T2 · axial · 5.0mm · 1.48mm/px · 1 of 38 slices shown (2 of 4)]
[im 1/38]
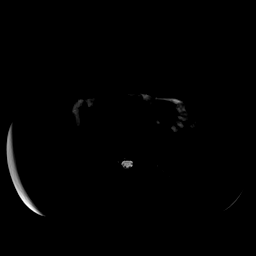

[Series 8: DWI · axial · 5.0mm · 1.42mm/px · z∈[-93,+129]mm · 3 of 114 slices shown (1 of 2)]
[im 1/114]
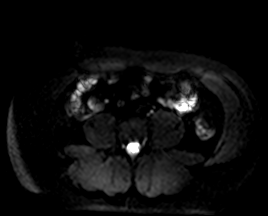
[im 57/114]
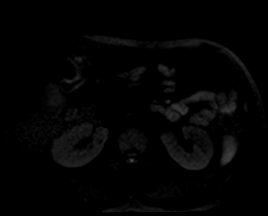
[im 114/114]
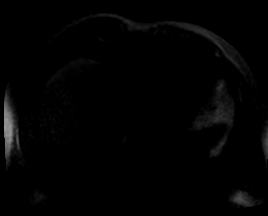

[Series 9: DWI · axial · 5.0mm · 1.42mm/px · 1 of 38 slices shown (2 of 2)]
[im 1/38]
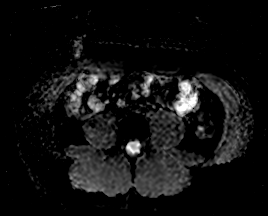

[Series 10: T2 · coronal · 5.0mm · 1.56mm/px · 1 of 37 slices shown (3 of 4)]
[im 1/37]
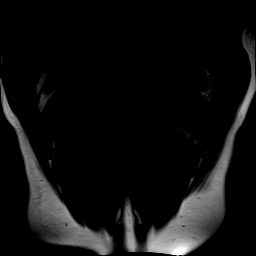

[Series 11: PD · axial · 3.5mm · 1.41mm/px · z∈[-54,+166]mm · 2 of 64 slices shown (1 of 7)]
[im 1/64]
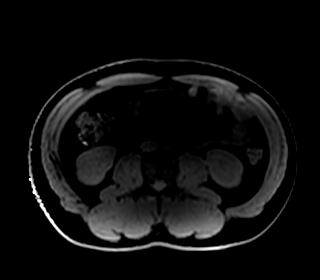
[im 64/64]
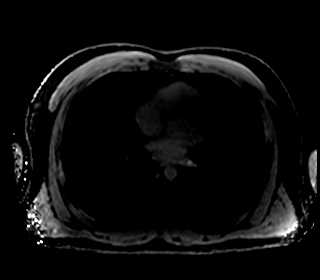

[Series 12: PD · axial · 3.5mm · 1.41mm/px · z∈[-54,+166]mm · 2 of 63 slices shown (2 of 7)]
[im 1/63]
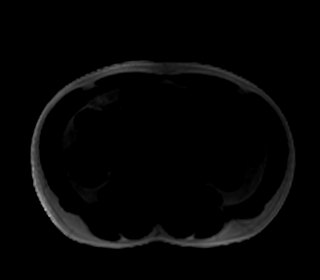
[im 63/63]
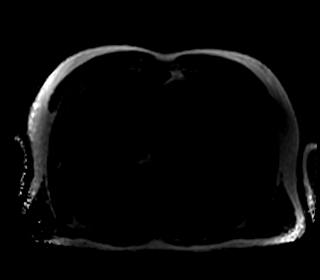

[Series 14: PD · axial · 3.5mm · 2.81mm/px · z∈[-54,+166]mm · 2 of 64 slices shown (3 of 7)]
[im 1/64]
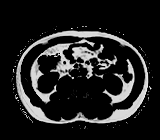
[im 64/64]
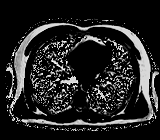

[Series 15: PD · axial · 3.5mm · 2.81mm/px · z∈[-54,+166]mm · 2 of 64 slices shown (4 of 7)]
[im 1/64]
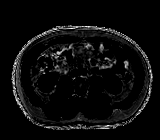
[im 64/64]
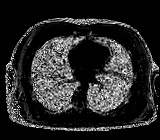

[Series 16: PD · axial · 3.5mm · 2.81mm/px · z∈[-54,+166]mm · 2 of 64 slices shown (5 of 7)]
[im 1/64]
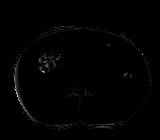
[im 64/64]
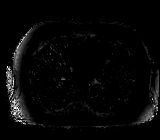

[Series 17: PD · axial · 3.5mm · 2.81mm/px · z∈[-54,+166]mm · 2 of 64 slices shown (6 of 7)]
[im 1/64]
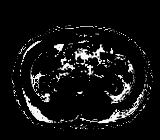
[im 64/64]
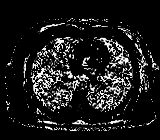

[Series 18: PD · axial · 3.5mm · 2.81mm/px · z∈[-54,+166]mm · 2 of 64 slices shown (7 of 7)]
[im 1/64]
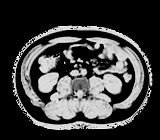
[im 64/64]
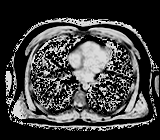

[Series 21: T2 · axial · 6.0mm · 1.19mm/px · 1 of 32 slices shown (4 of 4)]
[im 1/32]
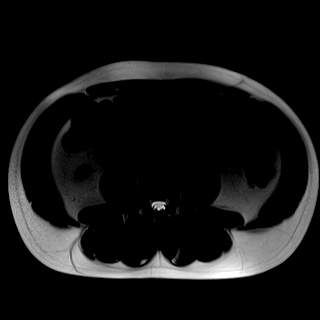

[Series 22: T1 dynamic · axial · non-contrast · 3.0mm · 1.25mm/px · z∈[-89,+124]mm · 2 of 72 slices shown]
[im 1/72]
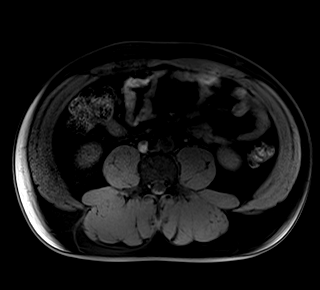
[im 72/72]
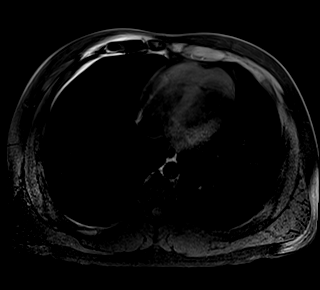

[Series 23: T1 dynamic post-contrast · axial · 3.0mm · 1.25mm/px · z∈[-89,+124]mm · 2 of 72 slices shown (1 of 9)]
[im 1/72]
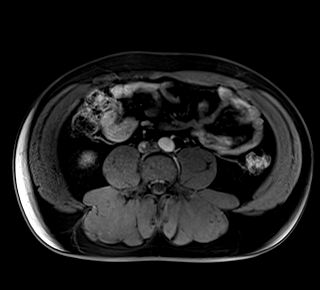
[im 72/72]
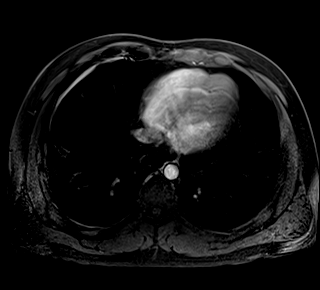

[Series 24: T1 dynamic post-contrast · axial · 3.0mm · 1.25mm/px · z∈[-89,+124]mm · 2 of 72 slices shown (2 of 9)]
[im 1/72]
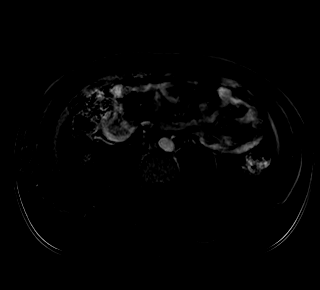
[im 72/72]
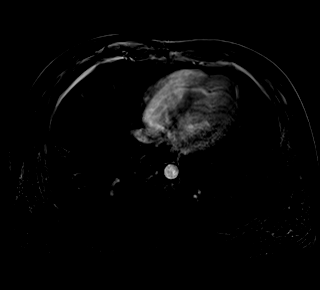

[Series 25: T1 dynamic post-contrast · axial · 3.0mm · 1.25mm/px · z∈[-89,+124]mm · 2 of 72 slices shown (3 of 9)]
[im 1/72]
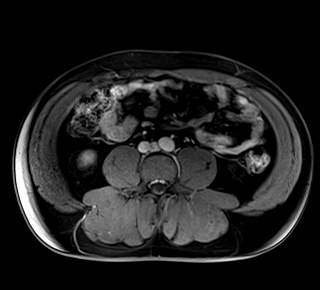
[im 72/72]
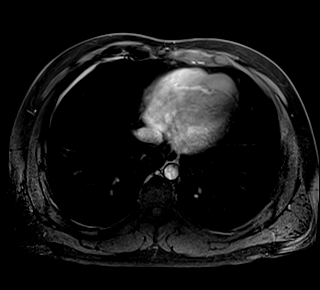

[Series 26: T1 dynamic post-contrast · axial · 3.0mm · 1.25mm/px · z∈[-89,+124]mm · 2 of 72 slices shown (4 of 9)]
[im 1/72]
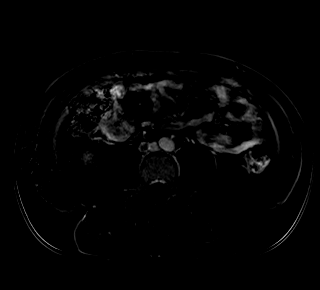
[im 72/72]
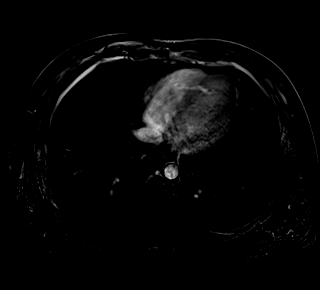

[Series 27: T1 dynamic post-contrast · axial · 3.0mm · 1.25mm/px · z∈[-89,+124]mm · 2 of 72 slices shown (5 of 9)]
[im 1/72]
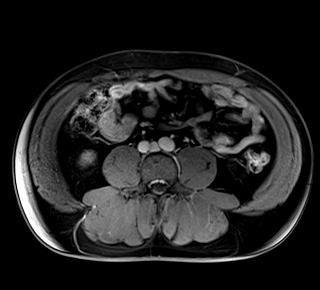
[im 72/72]
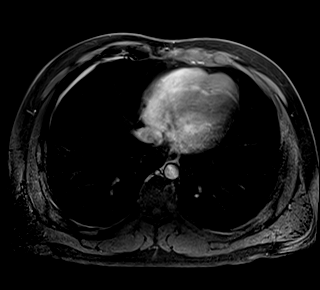

[Series 28: T1 dynamic post-contrast · axial · 3.0mm · 1.25mm/px · z∈[-89,+124]mm · 2 of 72 slices shown (6 of 9)]
[im 1/72]
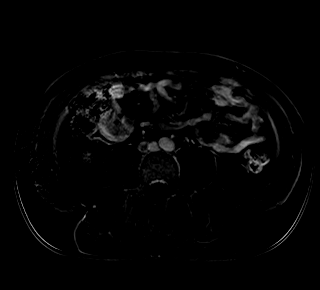
[im 72/72]
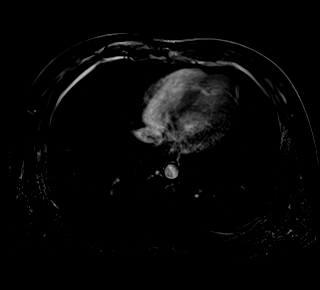

[Series 29: T1 dynamic post-contrast · coronal · 3.0mm · 1.25mm/px · 2 of 72 slices shown (7 of 9)]
[im 1/72]
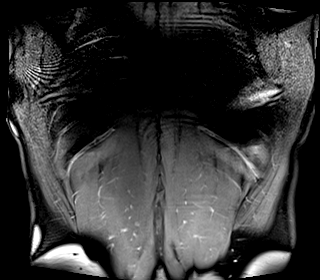
[im 72/72]
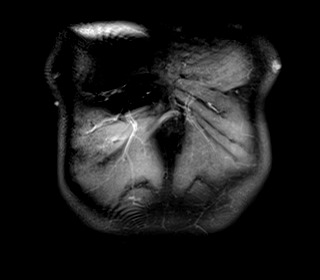

[Series 30: T1 dynamic post-contrast · axial · 3.0mm · 1.25mm/px · z∈[-89,+124]mm · 2 of 72 slices shown (8 of 9)]
[im 1/72]
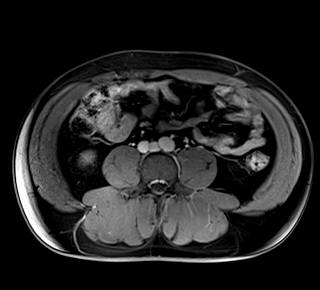
[im 72/72]
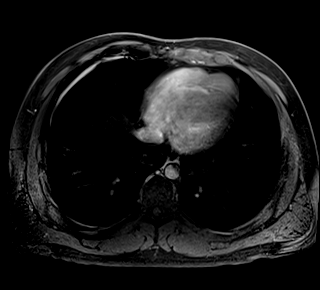

[Series 31: T1 dynamic post-contrast · axial · 3.0mm · 1.25mm/px · z∈[-89,+124]mm · 2 of 72 slices shown (9 of 9)]
[im 1/72]
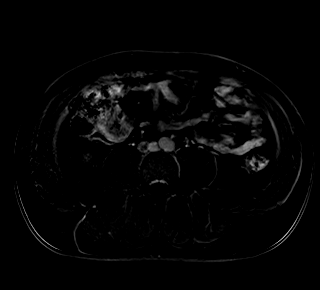
[im 72/72]
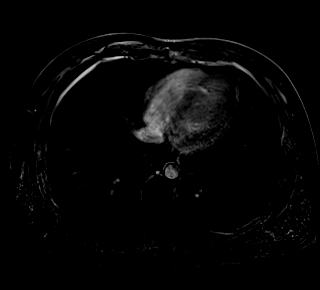

[47 of 48 positions shown; findings below may reference images not displayed]

FINDINGS: Lower chest: No acute findings.

Hepatobiliary: Liver is normal in size and contour. No evidence of
significant hepatic steatosis. No suspicious hepatic mass
visualized. Gallbladder is normal. No biliary ductal dilatation.

Pancreas: No mass, inflammatory changes, or other parenchymal
abnormality identified.

Spleen:  Within normal limits in size and appearance.

Adrenals/Urinary Tract: No masses identified. No evidence of
hydronephrosis.

Stomach/Bowel: Visualized portions within the abdomen are
unremarkable.

Vascular/Lymphatic: No pathologically enlarged lymph nodes
identified. No abdominal aortic aneurysm demonstrated.

Other:  No ascites.

Musculoskeletal: No suspicious bone lesions identified.
IMPRESSION: No acute process or suspicious hepatic mass identified.

## 2023-09-22 ENCOUNTER — Other Ambulatory Visit (HOSPITAL_BASED_OUTPATIENT_CLINIC_OR_DEPARTMENT_OTHER): Payer: Self-pay | Admitting: Family Medicine

## 2023-09-22 DIAGNOSIS — I10 Essential (primary) hypertension: Secondary | ICD-10-CM
